# Patient Record
Sex: Male | Born: 1970 | Race: Black or African American | Hispanic: No | Marital: Married | Smoking: Never smoker
Health system: Southern US, Community
[De-identification: ages and names within clinical notes are randomized; demographics above are authoritative.]

## PROBLEM LIST (undated history)

## (undated) DIAGNOSIS — M1990 Unspecified osteoarthritis, unspecified site: Secondary | ICD-10-CM

---

## 2007-09-03 ENCOUNTER — Emergency Department (HOSPITAL_COMMUNITY): Admission: EM | Admit: 2007-09-03 | Discharge: 2007-09-03 | Payer: Self-pay | Admitting: Emergency Medicine

## 2016-04-10 DIAGNOSIS — R3916 Straining to void: Secondary | ICD-10-CM | POA: Insufficient documentation

## 2016-04-10 DIAGNOSIS — N529 Male erectile dysfunction, unspecified: Secondary | ICD-10-CM | POA: Insufficient documentation

## 2016-04-10 DIAGNOSIS — N401 Enlarged prostate with lower urinary tract symptoms: Secondary | ICD-10-CM | POA: Insufficient documentation

## 2017-02-05 ENCOUNTER — Other Ambulatory Visit: Payer: Self-pay | Admitting: Family Medicine

## 2017-02-05 DIAGNOSIS — M5416 Radiculopathy, lumbar region: Secondary | ICD-10-CM

## 2017-02-05 DIAGNOSIS — M5136 Other intervertebral disc degeneration, lumbar region: Secondary | ICD-10-CM

## 2017-02-15 ENCOUNTER — Ambulatory Visit
Admission: RE | Admit: 2017-02-15 | Discharge: 2017-02-15 | Disposition: A | Discharge status: Home / Self Care | Payer: BC Managed Care – PPO | Source: Ambulatory Visit | Attending: Family Medicine | Admitting: Family Medicine

## 2017-02-15 DIAGNOSIS — M5136 Other intervertebral disc degeneration, lumbar region: Secondary | ICD-10-CM

## 2017-02-15 DIAGNOSIS — M5416 Radiculopathy, lumbar region: Secondary | ICD-10-CM

## 2017-02-17 ENCOUNTER — Ambulatory Visit
Admission: RE | Admit: 2017-02-17 | Discharge: 2017-02-17 | Disposition: A | Discharge status: Home / Self Care | Payer: BC Managed Care – PPO | Source: Ambulatory Visit | Attending: Family Medicine | Admitting: Family Medicine

## 2017-02-17 DIAGNOSIS — M5416 Radiculopathy, lumbar region: Secondary | ICD-10-CM

## 2017-02-17 MED ORDER — METHYLPREDNISOLONE ACETATE 40 MG/ML INJ SUSP (RADIOLOG
120.0000 mg | Freq: Once | INTRAMUSCULAR | Status: AC
  Administered 2017-02-17: 120 mg via EPIDURAL

## 2017-02-17 MED ORDER — IOPAMIDOL (ISOVUE-M 200) INJECTION 41%
1.0000 mL | Freq: Once | INTRAMUSCULAR | Status: AC
  Administered 2017-02-17: 1 mL via EPIDURAL

## 2017-02-17 NOTE — Discharge Instructions (Signed)

## 2017-11-11 DIAGNOSIS — M259 Joint disorder, unspecified: Secondary | ICD-10-CM | POA: Insufficient documentation

## 2017-12-07 DIAGNOSIS — M19019 Primary osteoarthritis, unspecified shoulder: Secondary | ICD-10-CM | POA: Insufficient documentation

## 2017-12-07 DIAGNOSIS — S46911A Strain of unspecified muscle, fascia and tendon at shoulder and upper arm level, right arm, initial encounter: Secondary | ICD-10-CM | POA: Insufficient documentation

## 2018-03-04 ENCOUNTER — Other Ambulatory Visit: Payer: Self-pay | Admitting: Unknown Physician Specialty

## 2018-03-04 DIAGNOSIS — G8929 Other chronic pain: Secondary | ICD-10-CM

## 2018-03-04 DIAGNOSIS — M545 Low back pain: Principal | ICD-10-CM

## 2018-03-11 ENCOUNTER — Ambulatory Visit
Admission: RE | Admit: 2018-03-11 | Discharge: 2018-03-11 | Disposition: A | Discharge status: Home / Self Care | Payer: BC Managed Care – PPO | Source: Ambulatory Visit | Attending: Unknown Physician Specialty | Admitting: Unknown Physician Specialty

## 2018-03-11 DIAGNOSIS — M545 Low back pain: Principal | ICD-10-CM

## 2018-03-11 DIAGNOSIS — G8929 Other chronic pain: Secondary | ICD-10-CM

## 2018-03-11 MED ORDER — METHYLPREDNISOLONE ACETATE 40 MG/ML INJ SUSP (RADIOLOG
120.0000 mg | Freq: Once | INTRAMUSCULAR | Status: AC
  Administered 2018-03-11: 120 mg via EPIDURAL

## 2018-03-11 MED ORDER — IOPAMIDOL (ISOVUE-M 200) INJECTION 41%
1.0000 mL | Freq: Once | INTRAMUSCULAR | Status: AC
  Administered 2018-03-11: 1 mL via EPIDURAL

## 2018-03-11 NOTE — Discharge Instructions (Signed)

## 2018-06-08 ENCOUNTER — Other Ambulatory Visit: Payer: Self-pay | Admitting: Physician Assistant

## 2018-06-08 DIAGNOSIS — G8929 Other chronic pain: Secondary | ICD-10-CM

## 2018-06-08 DIAGNOSIS — M545 Low back pain, unspecified: Secondary | ICD-10-CM

## 2018-06-23 ENCOUNTER — Ambulatory Visit
Admission: RE | Admit: 2018-06-23 | Discharge: 2018-06-23 | Disposition: A | Discharge status: Home / Self Care | Payer: BC Managed Care – PPO | Source: Ambulatory Visit | Attending: Physician Assistant | Admitting: Physician Assistant

## 2018-06-23 DIAGNOSIS — M545 Low back pain: Principal | ICD-10-CM

## 2018-06-23 DIAGNOSIS — G8929 Other chronic pain: Secondary | ICD-10-CM

## 2018-06-23 MED ORDER — METHYLPREDNISOLONE ACETATE 40 MG/ML INJ SUSP (RADIOLOG
120.0000 mg | Freq: Once | INTRAMUSCULAR | Status: AC
  Administered 2018-06-23: 120 mg via EPIDURAL

## 2018-06-23 MED ORDER — IOPAMIDOL (ISOVUE-M 200) INJECTION 41%
1.0000 mL | Freq: Once | INTRAMUSCULAR | Status: AC
  Administered 2018-06-23: 1 mL via EPIDURAL

## 2018-06-23 NOTE — Discharge Instructions (Signed)

## 2018-08-20 ENCOUNTER — Other Ambulatory Visit: Payer: Self-pay

## 2018-08-20 ENCOUNTER — Encounter (HOSPITAL_BASED_OUTPATIENT_CLINIC_OR_DEPARTMENT_OTHER): Payer: Self-pay | Admitting: Emergency Medicine

## 2018-08-20 ENCOUNTER — Emergency Department (HOSPITAL_BASED_OUTPATIENT_CLINIC_OR_DEPARTMENT_OTHER)
Admission: EM | Admit: 2018-08-20 | Discharge: 2018-08-21 | Disposition: A | Discharge status: Home / Self Care | Payer: BC Managed Care – PPO | Attending: Emergency Medicine | Admitting: Emergency Medicine

## 2018-08-20 DIAGNOSIS — X58XXXA Exposure to other specified factors, initial encounter: Secondary | ICD-10-CM | POA: Insufficient documentation

## 2018-08-20 DIAGNOSIS — Y929 Unspecified place or not applicable: Secondary | ICD-10-CM | POA: Insufficient documentation

## 2018-08-20 DIAGNOSIS — S39012A Strain of muscle, fascia and tendon of lower back, initial encounter: Secondary | ICD-10-CM | POA: Diagnosis not present

## 2018-08-20 DIAGNOSIS — M545 Low back pain: Secondary | ICD-10-CM | POA: Diagnosis present

## 2018-08-20 DIAGNOSIS — Y999 Unspecified external cause status: Secondary | ICD-10-CM | POA: Insufficient documentation

## 2018-08-20 DIAGNOSIS — Y939 Activity, unspecified: Secondary | ICD-10-CM | POA: Insufficient documentation

## 2018-08-20 HISTORY — DX: Unspecified osteoarthritis, unspecified site: M19.90

## 2018-08-20 MED ORDER — ONDANSETRON HCL 4 MG/2ML IJ SOLN
4.0000 mg | Freq: Once | INTRAMUSCULAR | Status: AC
  Administered 2018-08-21: 4 mg via INTRAVENOUS
  Filled 2018-08-20: qty 2

## 2018-08-20 MED ORDER — HYDROMORPHONE HCL 1 MG/ML IJ SOLN
1.0000 mg | Freq: Once | INTRAMUSCULAR | Status: AC
  Administered 2018-08-21: 1 mg via INTRAVENOUS
  Filled 2018-08-20: qty 1

## 2018-08-20 NOTE — ED Triage Notes (Signed)
Pt c/o left flank pain for the past 2 days getting worse today.

## 2018-08-20 NOTE — ED Provider Notes (Signed)
[facility] EMERGENCY DEPARTMENT Provider Note   CSN: [id number] Arrival date & time: 08/20/18  2311     History   Chief Complaint Chief Complaint  Patient presents with  . Flank Pain    HPI David Hutchinson is a 48 y.o. male.  Patient presents to the emergency department for evaluation of left flank pain.  Patient reports that he had sudden onset of pain in the left lower back yesterday after he came home from work and was getting undressed.  He reports that he went to sleep and woke up this morning without pain but then during the day the pain came back.  He has not had any urinary symptoms.  He has noticed that the pain worsens when he bends and twists.  Pain does not radiate to the leg.  No numbness, tingling, weakness of lower extremities.     Past Medical History:  Diagnosis Date  . Arthritis     There are no active problems to display for this patient.   History reviewed. No pertinent surgical history.      Home Medications    Prior to Admission medications   Medication Sig Start Date End Date Taking? Authorizing Provider  diazepam (VALIUM) 5 MG tablet Take 1 tablet (5 mg total) by mouth every 6 (six) hours as needed for anxiety (spasms). 08/21/18   Gilda Crease, MD  HYDROcodone-acetaminophen (NORCO/VICODIN) 5-325 MG tablet Take 1-2 tablets by mouth every 4 (four) hours as needed for moderate pain. 08/21/18   Gilda Crease, MD    Family History History reviewed. No pertinent family history.  Social History Social History   Tobacco Use  . Smoking status: Never Smoker  . Smokeless tobacco: Never Used  Substance Use Topics  . Alcohol use: Never    Frequency: Never  . Drug use: Never     Allergies   Patient has no known allergies.   Review of Systems Review of Systems  Musculoskeletal: Positive for back pain.  All other systems reviewed and are negative.    Physical Exam Updated Vital Signs BP 118/79 (BP Location:  Right Arm)   Pulse 65   Resp 16   Ht 6' (1.829 m)   Wt 134.7 kg   SpO2 97%   BMI 40.28 kg/m   Physical Exam Vitals signs and nursing note reviewed.  Constitutional:      General: He is not in acute distress.    Appearance: Normal appearance. He is well-developed.  HENT:     Head: Normocephalic and atraumatic.     Right Ear: Hearing normal.     Left Ear: Hearing normal.     Nose: Nose normal.  Eyes:     Conjunctiva/sclera: Conjunctivae normal.     Pupils: Pupils are equal, round, and reactive to light.  Neck:     Musculoskeletal: Normal range of motion and neck supple.  Cardiovascular:     Rate and Rhythm: Regular rhythm.     Heart sounds: S1 normal and S2 normal. No murmur. No friction rub. No gallop.   Pulmonary:     Effort: Pulmonary effort is normal. No respiratory distress.     Breath sounds: Normal breath sounds.  Chest:     Chest wall: No tenderness.  Abdominal:     General: Bowel sounds are normal.     Palpations: Abdomen is soft.     Tenderness: There is no abdominal tenderness. There is no guarding or rebound. Negative signs include Murphy's sign and McBurney's  sign.     Hernia: No hernia is present.  Musculoskeletal: Normal range of motion.     Lumbar back: He exhibits tenderness.       Back:  Skin:    General: Skin is warm and dry.     Findings: No rash.  Neurological:     Mental Status: He is alert and oriented to person, place, and time.     GCS: GCS eye subscore is 4. GCS verbal subscore is 5. GCS motor subscore is 6.     Cranial Nerves: No cranial nerve deficit.     Sensory: No sensory deficit.     Coordination: Coordination normal.  Psychiatric:        Speech: Speech normal.        Behavior: Behavior normal.        Thought Content: Thought content normal.      ED Treatments / Results  Labs (all labs ordered are listed, but only abnormal results are displayed) Labs Reviewed  URINALYSIS, ROUTINE W REFLEX MICROSCOPIC - Abnormal; Notable for  the following components:      Result Value   Bilirubin Urine MODERATE (*)    All other components within normal limits  CBC WITH DIFFERENTIAL/PLATELET  BASIC METABOLIC PANEL    EKG None  Radiology No results found.  Procedures Procedures (including critical care time)  Medications Ordered in ED Medications  ondansetron (ZOFRAN) injection 4 mg (4 mg Intravenous Given 08/21/18 0006)  HYDROmorphone (DILAUDID) injection 1 mg (1 mg Intravenous Given 08/21/18 0006)     Initial Impression / Assessment and Plan / ED Course  I have reviewed the triage vital signs and the nursing notes.  Pertinent labs & imaging results that were available during my care of the patient were reviewed by me and considered in my medical decision making (see chart for details).     Patient presents with left-sided back pain.  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  Pain is higher up in the lumbar region.  He has not noticed any urinary symptoms.  Pain does worsen with movement, most consistent with musculoskeletal pain.  Blood work unremarkable.  Urinalysis unremarkable, no sign of infection and no microscopic hematuria to suggest kidney stone.  Patient improved with analgesia, will treat with rest and analgesia.  Final Clinical Impressions(s) / ED Diagnoses   Final diagnoses:  Lumbar strain, initial encounter    ED Discharge Orders         Ordered    HYDROcodone-acetaminophen (NORCO/VICODIN) 5-325 MG tablet  Every 4 hours PRN     08/21/18 0215    diazepam (VALIUM) 5 MG tablet  Every 6 hours PRN     08/21/18 0215           Gilda Crease, MD 08/21/18 [phone]

## 2018-08-21 LAB — BASIC METABOLIC PANEL
ANION GAP: 6 (ref 5–15)
BUN: 15 mg/dL (ref 6–20)
CALCIUM: 9.4 mg/dL (ref 8.9–10.3)
CO2: 26 mmol/L (ref 22–32)
Chloride: 104 mmol/L (ref 98–111)
Creatinine, Ser: 1.12 mg/dL (ref 0.61–1.24)
GFR calc Af Amer: >60 mL/min (ref >60)
Glucose, Bld: 90 mg/dL (ref 70–99)
POTASSIUM: 3.8 mmol/L (ref 3.5–5.1)
Sodium: 136 mmol/L (ref 135–145)

## 2018-08-21 LAB — URINALYSIS, ROUTINE W REFLEX MICROSCOPIC
Glucose, UA: NEGATIVE mg/dL
Hgb urine dipstick: NEGATIVE
KETONES UR: NEGATIVE mg/dL
LEUKOCYTES UA: NEGATIVE
NITRITE: NEGATIVE
PROTEIN: NEGATIVE mg/dL
Specific Gravity, Urine: 1.025 (ref 1.005–1.030)
pH: 5.5 (ref 5.0–8.0)

## 2018-08-21 LAB — CBC WITH DIFFERENTIAL/PLATELET
ABS IMMATURE GRANULOCYTES: 0.02 10*3/uL (ref 0.00–0.07)
BASOS ABS: 0.1 10*3/uL (ref 0.0–0.1)
Basophils Relative: 1 %
EOS ABS: 0.3 10*3/uL (ref 0.0–0.5)
Eosinophils Relative: 5 %
HEMATOCRIT: 41.9 % (ref 39.0–52.0)
Hemoglobin: 13.6 g/dL (ref 13.0–17.0)
Immature Granulocytes: 0 %
LYMPHS ABS: 3 10*3/uL (ref 0.7–4.0)
Lymphocytes Relative: 48 %
MCH: 28.5 pg (ref 26.0–34.0)
MCHC: 32.5 g/dL (ref 30.0–36.0)
MCV: 87.8 fL (ref 80.0–100.0)
Monocytes Absolute: 0.4 10*3/uL (ref 0.1–1.0)
Monocytes Relative: 6 %
NEUTROS ABS: 2.4 10*3/uL (ref 1.7–7.7)
NRBC: 0 % (ref 0.0–0.2)
Neutrophils Relative %: 40 %
PLATELETS: 216 10*3/uL (ref 150–400)
RBC: 4.77 MIL/uL (ref 4.22–5.81)
RDW: 13.6 % (ref 11.5–15.5)
WBC: 6.1 10*3/uL (ref 4.0–10.5)

## 2018-08-21 MED ORDER — HYDROCODONE-ACETAMINOPHEN 5-325 MG PO TABS: 1.0000 | ORAL_TABLET | ORAL | 0 refills | Status: DC | PRN

## 2018-08-21 MED ORDER — DIAZEPAM 5 MG PO TABS: 5.0000 mg | ORAL_TABLET | Freq: Four times a day (QID) | ORAL | 0 refills | Status: DC | PRN

## 2018-09-08 ENCOUNTER — Other Ambulatory Visit: Payer: Self-pay | Admitting: Physician Assistant

## 2018-09-08 DIAGNOSIS — M545 Low back pain: Principal | ICD-10-CM

## 2018-09-08 DIAGNOSIS — G8929 Other chronic pain: Secondary | ICD-10-CM

## 2018-09-15 ENCOUNTER — Ambulatory Visit
Admission: RE | Admit: 2018-09-15 | Discharge: 2018-09-15 | Disposition: A | Discharge status: Home / Self Care | Payer: BC Managed Care – PPO | Source: Ambulatory Visit | Attending: Physician Assistant | Admitting: Physician Assistant

## 2018-09-15 DIAGNOSIS — M545 Low back pain: Principal | ICD-10-CM

## 2018-09-15 DIAGNOSIS — G8929 Other chronic pain: Secondary | ICD-10-CM

## 2018-09-15 MED ORDER — METHYLPREDNISOLONE ACETATE 40 MG/ML INJ SUSP (RADIOLOG
120.0000 mg | Freq: Once | INTRAMUSCULAR | Status: AC
  Administered 2018-09-15: 120 mg via EPIDURAL

## 2018-09-15 MED ORDER — IOPAMIDOL (ISOVUE-M 200) INJECTION 41%
1.0000 mL | Freq: Once | INTRAMUSCULAR | Status: AC
  Administered 2018-09-15: 1 mL via EPIDURAL

## 2018-09-15 NOTE — Discharge Instructions (Signed)

## 2018-09-16 ENCOUNTER — Other Ambulatory Visit: Payer: Self-pay | Admitting: Orthopedic Surgery

## 2018-09-16 DIAGNOSIS — M4316 Spondylolisthesis, lumbar region: Secondary | ICD-10-CM

## 2018-09-18 ENCOUNTER — Ambulatory Visit
Admission: RE | Admit: 2018-09-18 | Discharge: 2018-09-18 | Disposition: A | Discharge status: Home / Self Care | Payer: BC Managed Care – PPO | Source: Ambulatory Visit | Attending: Orthopedic Surgery | Admitting: Orthopedic Surgery

## 2018-09-18 ENCOUNTER — Other Ambulatory Visit: Payer: BC Managed Care – PPO

## 2018-09-18 DIAGNOSIS — M4316 Spondylolisthesis, lumbar region: Secondary | ICD-10-CM

## 2019-10-23 IMAGING — MR MR LUMBAR SPINE W/O CM
4 of 5 series · 25 of 48 positions shown · non-contrast
Comparison: 02/15/2017 MRI of the lumbar spine.

CLINICAL DATA: 47 y/o M; lower back pain with pain in the bilateral
hamstrings for 2 years. Left leg weakness for 6 months.

EXAM:
MRI LUMBAR SPINE WITHOUT CONTRAST
TECHNIQUE: Multiplanar, multisequence MR imaging of the lumbar spine was
performed. No intravenous contrast was administered.

[Series 3: T2 post-contrast · sagittal · 4.0mm · 0.55mm/px · 6 of 13 slices shown]
[im 1/13]
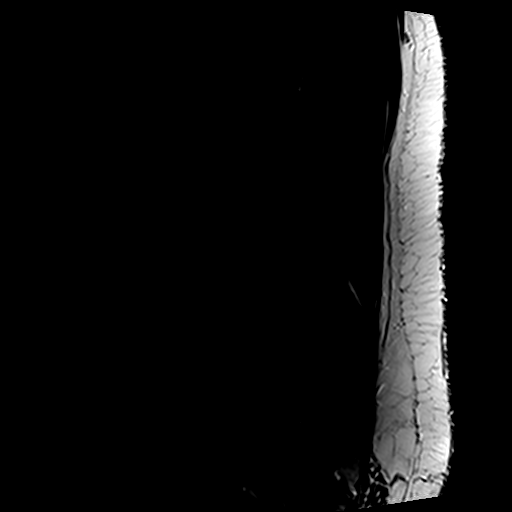
[im 3/13]
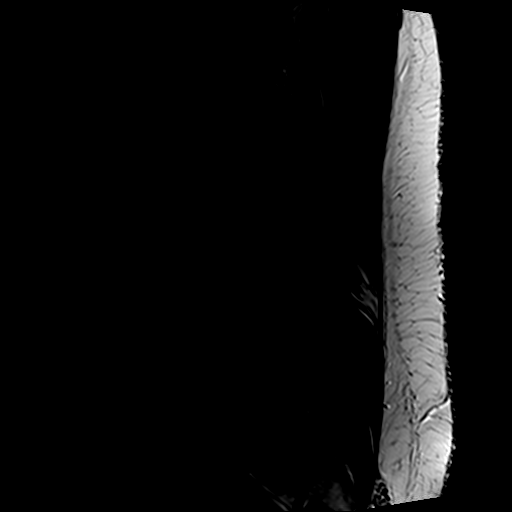
[im 5/13]
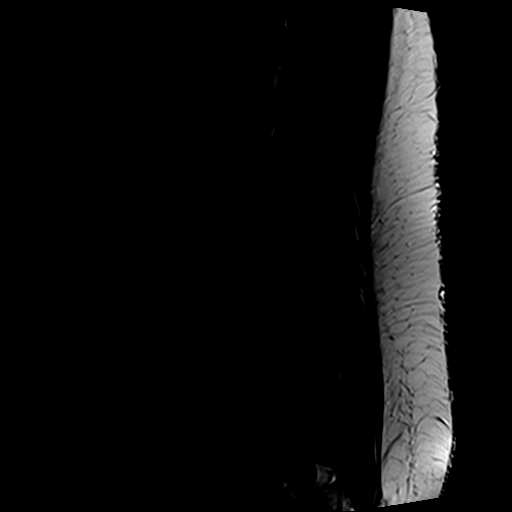
[im 8/13]
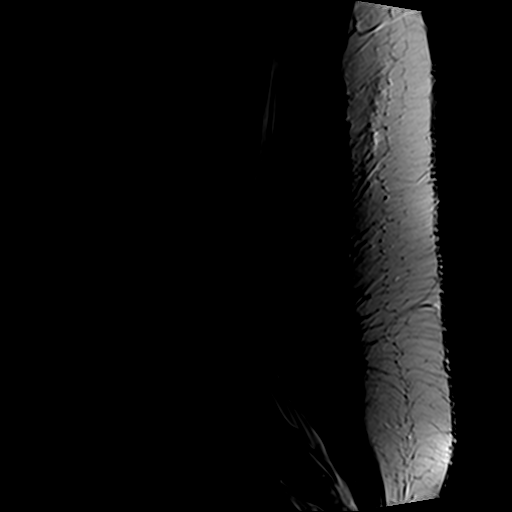
[im 10/13]
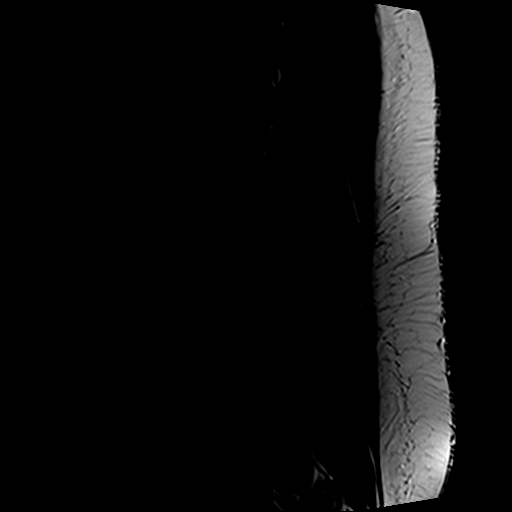
[im 13/13]
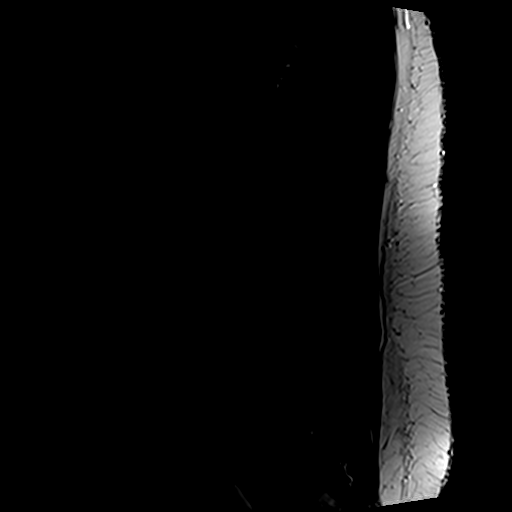

[Series 5: T1 · sagittal · 4.0mm · 0.55mm/px · 6 of 13 slices shown (1 of 2)]
[im 1/13]
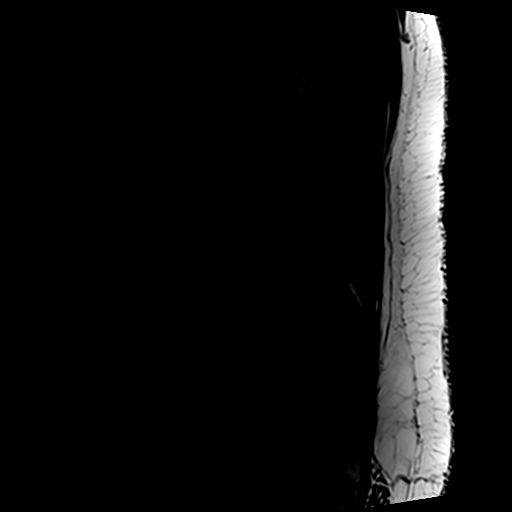
[im 3/13]
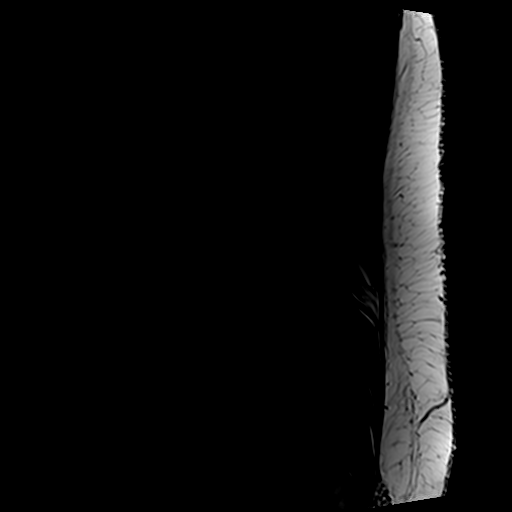
[im 5/13]
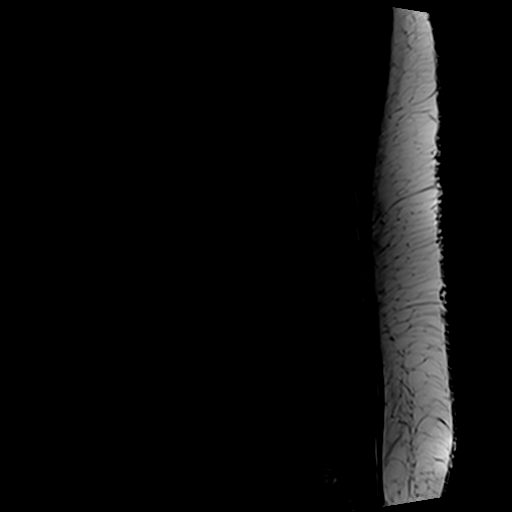
[im 8/13]
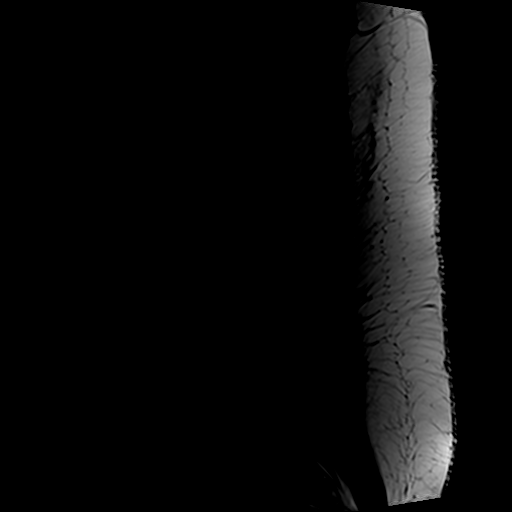
[im 10/13]
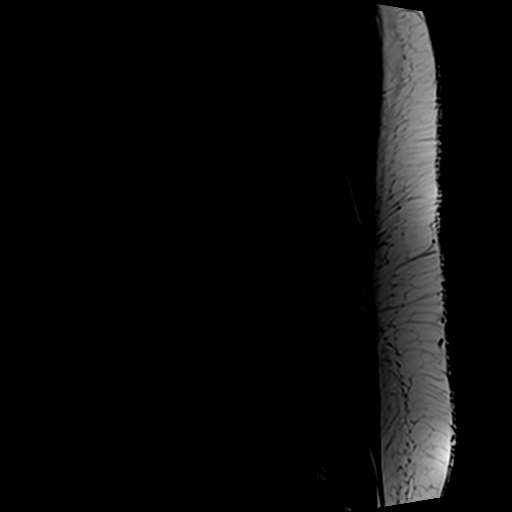
[im 13/13]
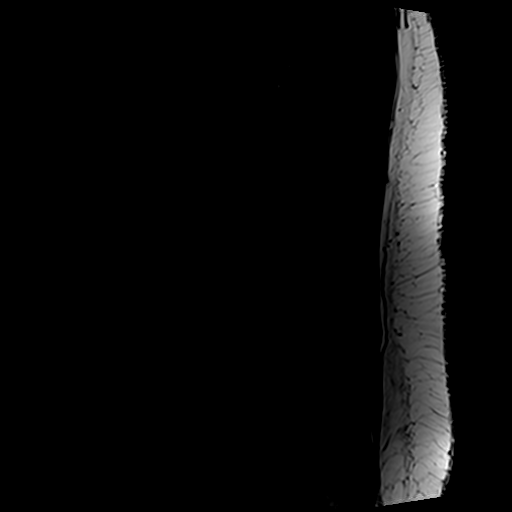

[Series 6: T1 · axial · 4.0mm · 0.39mm/px · z∈[-119,+60]mm · 4 of 35 slices shown (2 of 2)]
[im 1/35]
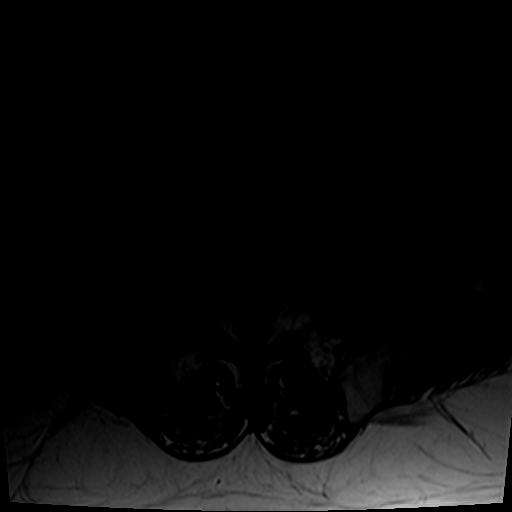
[im 5/35]
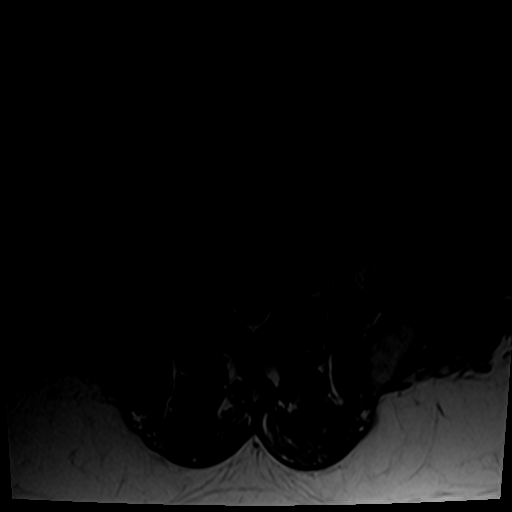
[im 18/35]
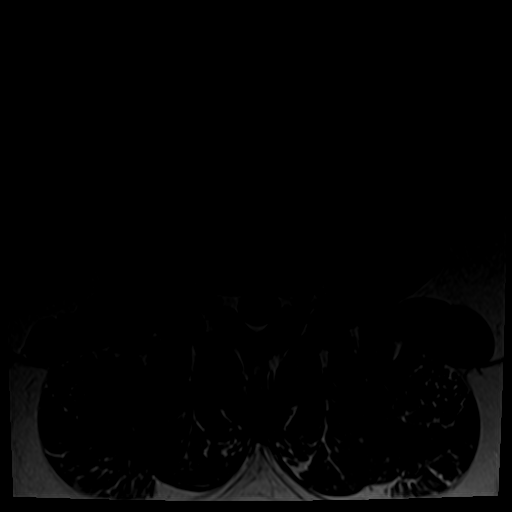
[im 30/35]
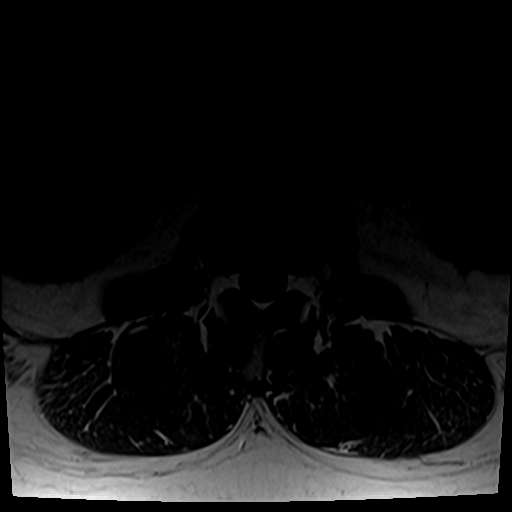

[Series 7: T2 · axial · 4.0mm · 0.78mm/px · z∈[-119,+101]mm · 9 of 35 slices shown]
[im 1/35]
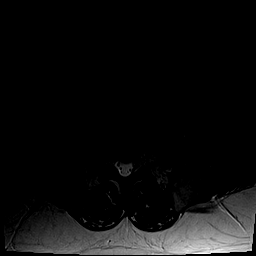
[im 5/35]
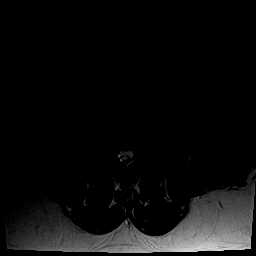
[im 10/35]
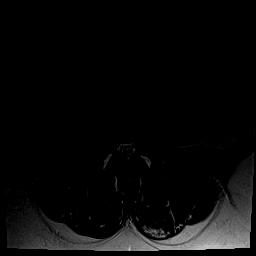
[im 15/35]
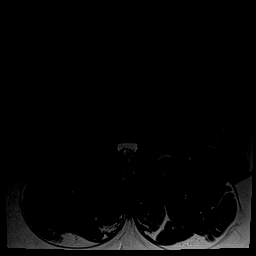
[im 18/35]
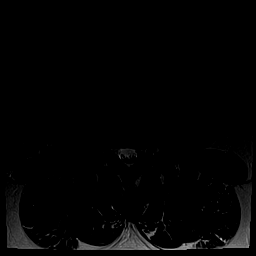
[im 20/35]
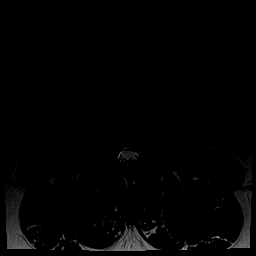
[im 25/35]
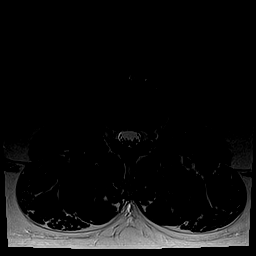
[im 30/35]
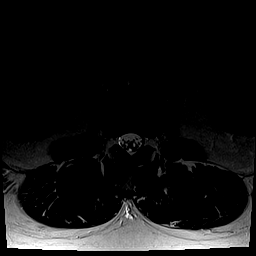
[im 35/35]
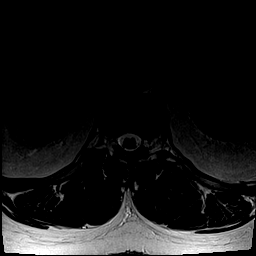

[25 of 48 positions shown; findings below may reference images not displayed]

FINDINGS: Segmentation:  Standard.

Alignment:  Physiologic.

Vertebrae: No fracture, evidence of discitis, or bone lesion. L4-5
facet edema and small effusions.

Conus medullaris and cauda equina: Conus extends to the L1-2 level.
Conus and cauda equina appear normal.

Paraspinal and other soft tissues: Negative.

Disc levels:

T12-L1: No significant disc displacement, foraminal stenosis, or
canal stenosis.

L1-2: No significant disc displacement, foraminal stenosis, or canal
stenosis.

L2-3: No significant disc displacement, foraminal stenosis, or canal
stenosis.

L3-4: Mild disc bulge eccentric to the left. Mild left foraminal
stenosis. No spinal canal stenosis.

L4-5: Disc bulge with facet and ligamentum flavum hypertrophy. Mild
bilateral foraminal, lateral recess, and spinal canal stenosis.

L5-S1: Disc bulge with endplate marginal osteophytes in the
foraminal and extraforaminal zones as well as ligamentum flavum and
facet hypertrophy. Mild-to-moderate bilateral foraminal stenosis and
mild spinal canal stenosis.
IMPRESSION: 1. L4-5 facet edema and effusions, likely degenerative facet
arthritis.
2. Lumbar spondylosis predominantly at the L4-5 and L5-S1 levels
where disc and facet degenerative changes contribute to mild spinal
canal stenosis as well as mild L4-5 and mild to moderate L5-S1
foraminal stenosis.

## 2019-11-07 ENCOUNTER — Telehealth: Payer: Self-pay

## 2019-11-07 ENCOUNTER — Encounter: Payer: Self-pay | Admitting: Podiatry

## 2019-11-07 ENCOUNTER — Other Ambulatory Visit: Payer: Self-pay

## 2019-11-07 ENCOUNTER — Ambulatory Visit (INDEPENDENT_AMBULATORY_CARE_PROVIDER_SITE_OTHER): Payer: BC Managed Care – PPO

## 2019-11-07 ENCOUNTER — Ambulatory Visit: Payer: BC Managed Care – PPO | Admitting: Podiatry

## 2019-11-07 VITALS — Temp 97.6°F

## 2019-11-07 DIAGNOSIS — M722 Plantar fascial fibromatosis: Secondary | ICD-10-CM

## 2019-11-07 NOTE — Telephone Encounter (Signed)
DOS 12/01/19  EPF LT  [employer] ST EFFECTIVE DATE - 07/22/2019       In-Network   Max Per Benefit Period Year-to-Date Remaining     CoInsurance 20%      Deductible $1250.00 $1153.45     Out-Of-Pocket $4890.00 $2876.81

## 2019-11-07 NOTE — Patient Instructions (Signed)
Pre-Operative Instructions  Congratulations, you have decided to take an important step towards improving your quality of life.  You can be assured that the doctors and staff at [facility] will be with you every step of the way.  Here are some important things you should know:  1. Plan to be at the surgery center/hospital at least 1 (one) hour prior to your scheduled time, unless otherwise directed by the surgical center/hospital staff.  You must have a responsible adult accompany you, remain during the surgery and drive you home.  Make sure you have directions to the surgical center/hospital to ensure you arrive on time. 2. If you are having surgery at [facility] or [facility], you will need a copy of your medical history and physical form from your family physician within one month prior to the date of surgery. We will give you a form for your primary physician to complete.  3. We make every effort to accommodate the date you request for surgery.  However, there are times where surgery dates or times have to be moved.  We will contact you as soon as possible if a change in schedule is required.   4. No aspirin/ibuprofen for one week before surgery.  If you are on aspirin, any non-steroidal anti-inflammatory medications (Mobic, Aleve, Ibuprofen) should not be taken seven (7) days prior to your surgery.  You make take Tylenol for pain prior to surgery.  5. Medications - If you are taking daily heart and blood pressure medications, seizure, reflux, allergy, asthma, anxiety, pain or diabetes medications, make sure you notify the surgery center/hospital before the day of surgery so they can tell you which medications you should take or avoid the day of surgery. 6. No food or drink after midnight the night before surgery unless directed otherwise by surgical center/hospital staff. 7. No alcoholic beverages 24-hours prior to surgery.  No smoking 24-hours prior or 24-hours after  surgery. 8. Wear loose pants or shorts. They should be loose enough to fit over bandages, boots, and casts. 9. Don't wear slip-on shoes. Sneakers are preferred. 10. Bring your boot with you to the surgery center/hospital.  Also bring crutches or a walker if your physician has prescribed it for you.  If you do not have this equipment, it will be provided for you after surgery. 11. If you have not been contacted by the surgery center/hospital by the day before your surgery, call to confirm the date and time of your surgery. 12. Leave-time from work may vary depending on the type of surgery you have.  Appropriate arrangements should be made prior to surgery with your employer. 13. Prescriptions will be provided immediately following surgery by your doctor.  Fill these as soon as possible after surgery and take the medication as directed. Pain medications will not be refilled on weekends and must be approved by the doctor. 14. Remove nail polish on the operative foot and avoid getting pedicures prior to surgery. 15. Wash the night before surgery.  The night before surgery wash the foot and leg well with water and the antibacterial soap provided. Be sure to pay special attention to beneath the toenails and in between the toes.  Wash for at least three (3) minutes. Rinse thoroughly with water and dry well with a towel.  Perform this wash unless told not to do so by your physician.  Enclosed: 1 Ice pack (please put in freezer the night before surgery)   1 Hibiclens skin cleaner     Pre-op instructions  If you have any questions regarding the instructions, please do not hesitate to call our office.  [place]: [address], [place], [place] [address] -- [phone]  [place]: [address] [place], [place] [address] -- [phone]  [place]: [address], [place], [place] [address] -- [phone]   Website: [email or web address] 

## 2019-11-09 NOTE — Progress Notes (Signed)
   Subjective: 49 y.o. male presenting today as a new patient with a chief complaint of aching pain to the left plantar heel that began about one year ago. He reports associated numbness of the area. The symptoms are worse in the morning when he first gets out of bed and after walking excessively. He has taken Ibuprofen 800 mg and used Aspercreme for treatment. He was prescribed Meloxicam by Dr. Elijah Birk which he has not taken. Patient is here for further evaluation and treatment.   Past Medical History:  Diagnosis Date  . Arthritis      Objective: Physical Exam General: The patient is alert and oriented x3 in no acute distress.  Dermatology: Skin is warm, dry and supple bilateral lower extremities. Negative for open lesions or macerations bilateral.   Vascular: Dorsalis Pedis and Posterior Tibial pulses palpable bilateral.  Capillary fill time is immediate to all digits.  Neurological: Epicritic and protective threshold intact bilateral.   Musculoskeletal: Tenderness to palpation to the plantar aspect of the left heel along the plantar fascia. All other joints range of motion within normal limits bilateral. Strength 5/5 in all groups bilateral.   Radiographic exam: Normal osseous mineralization. Joint spaces preserved. No fracture/dislocation/boney destruction. No other soft tissue abnormalities or radiopaque foreign bodies.   Assessment: 1. Plantar fasciitis left foot x years   Plan of Care:  1. Patient evaluated. Xrays reviewed.   2. Today we discussed the conservative versus surgical management of the presenting pathology. The patient opts for surgical management. All possible complications and details of the procedure were explained. All patient questions were answered. No guarantees were expressed or implied. 3. Authorization for surgery was initiated today. Surgery will consist of EPF left.  4. Return to clinic one week post op.   Emergency planning/management officer for [employer].    Felecia Shelling,  DPM [facility]  Dr. Felecia Shelling, DPM    [address] [place], [place] [address]                Office [phone]  Fax [phone]

## 2019-11-14 ENCOUNTER — Other Ambulatory Visit: Payer: Self-pay | Admitting: Orthopedic Surgery

## 2019-11-14 DIAGNOSIS — M47816 Spondylosis without myelopathy or radiculopathy, lumbar region: Secondary | ICD-10-CM

## 2019-11-29 ENCOUNTER — Telehealth: Payer: Self-pay

## 2019-11-29 NOTE — Telephone Encounter (Signed)
Patient called to cancel surgery scheduled for 12/01/19. He stated he is having back surgery the end of May and may not need to have foot surgery after his back surgery. Notified Cynthia at [employer].

## 2019-12-07 ENCOUNTER — Encounter: Payer: BC Managed Care – PPO | Admitting: Podiatry

## 2019-12-08 ENCOUNTER — Other Ambulatory Visit: Payer: BC Managed Care – PPO

## 2019-12-10 ENCOUNTER — Other Ambulatory Visit: Payer: BC Managed Care – PPO

## 2019-12-14 ENCOUNTER — Encounter: Payer: BC Managed Care – PPO | Admitting: Podiatry

## 2019-12-28 ENCOUNTER — Encounter: Payer: BC Managed Care – PPO | Admitting: Podiatry
